# Patient Record
Sex: Male | Born: 1965 | Race: White
Health system: Northeastern US, Academic
[De-identification: ages and names within clinical notes are randomized; demographics above are authoritative.]

---

## 2011-05-30 ENCOUNTER — Inpatient Hospital Stay: Payer: Self-pay | Admitting: Unknown Physician Specialty

## 2011-06-12 ENCOUNTER — Inpatient Hospital Stay: Payer: Self-pay | Admitting: Psychiatry

## 2012-09-15 IMAGING — CT CT HEAD WITHOUT CONTRAST
2 series · 16 of 30 positions shown, 20 images · non-contrast
Comparison: none

REASON FOR EXAM: Head Injury secondary to fall
COMMENTS:

PROCEDURE:     CT  - CT HEAD WITHOUT CONTRAST  - June 16, 2011 [DATE]
RESULT:
HISTORY: Fall.
COMPARISON STUDIES:  No prior.
PROCEDURE AND FINDINGS:  Standard nonenhanced CT obtained. No mass lesion.
No hydrocephalus or hemorrhage. No acute bony abnormality.

[Series 2: without · axial · non-contrast · 0.45mm/px · z∈[-178,-54]mm · 13 of 31 slices shown, 17 images]
[im 3/31  brain]
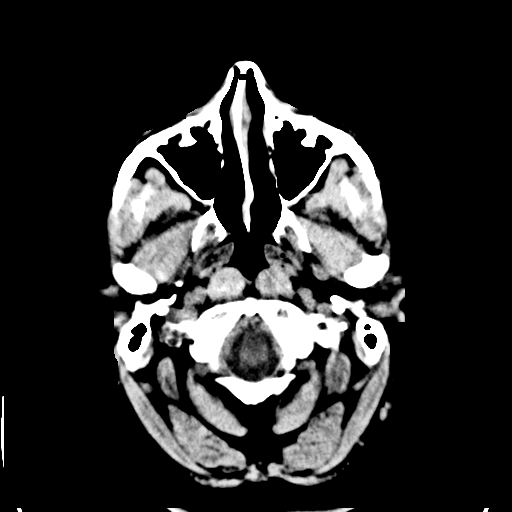
[im 3/31  bone]
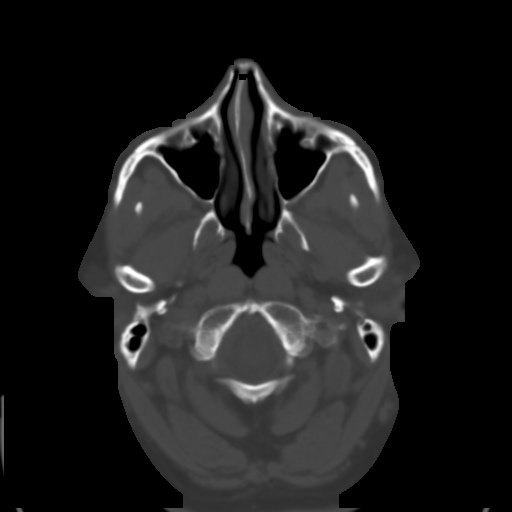
[im 5/31  brain]
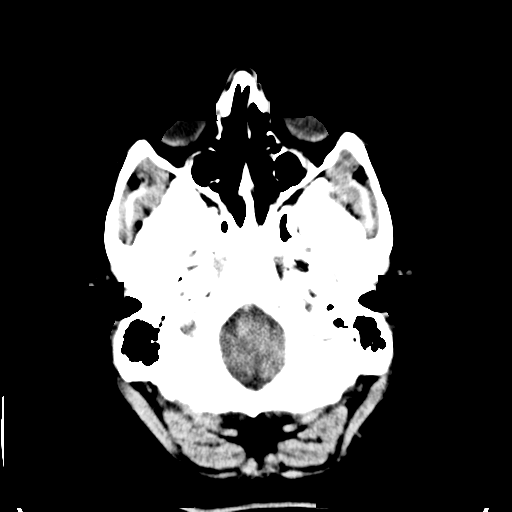
[im 7/31  brain]
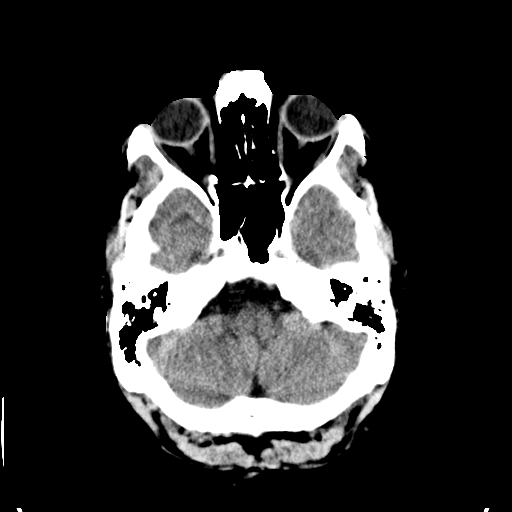
[im 9/31  brain]
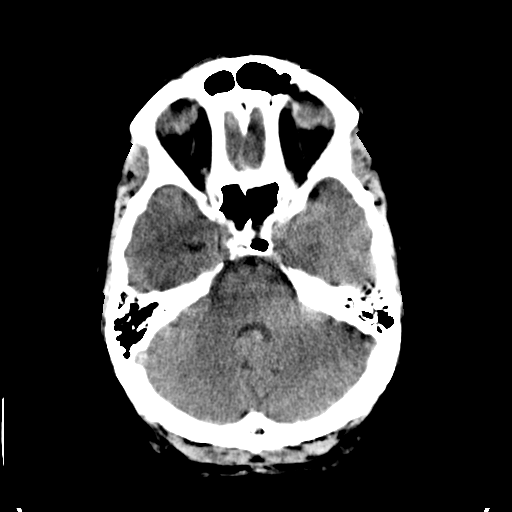
[im 11/31  brain]
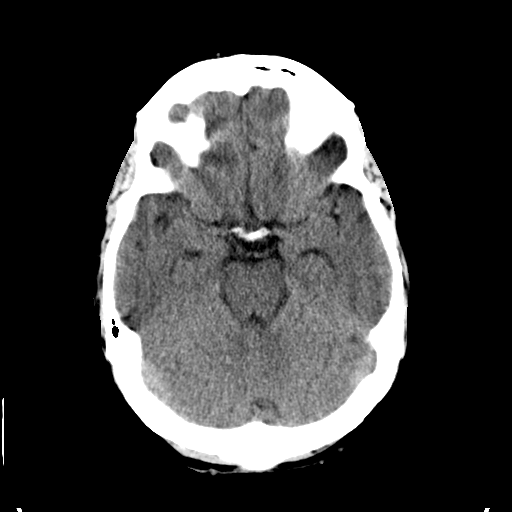
[im 11/31  bone]
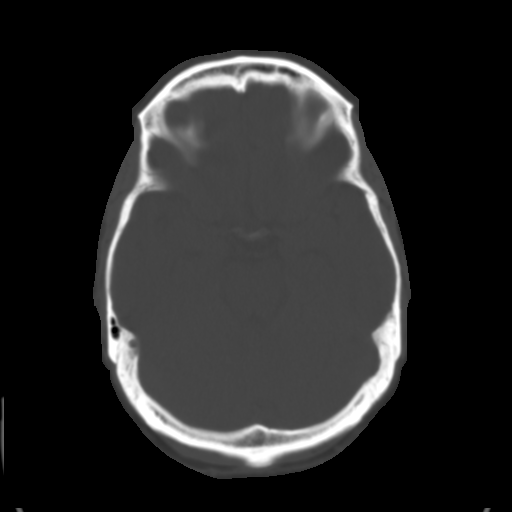
[im 13/31  brain]
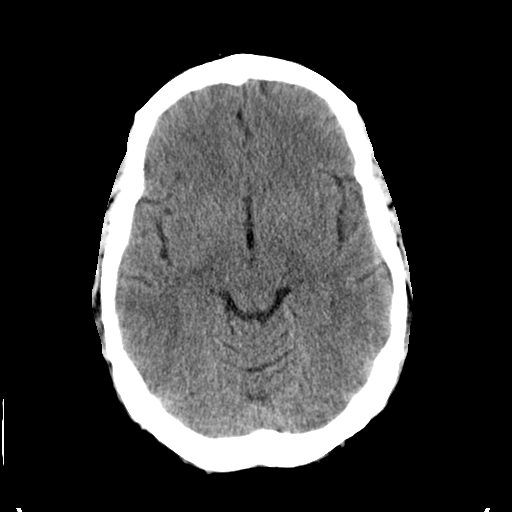
[im 16/31  brain]
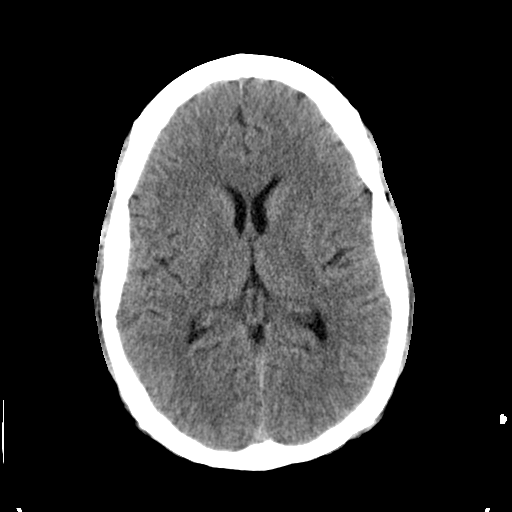
[im 18/31  brain]
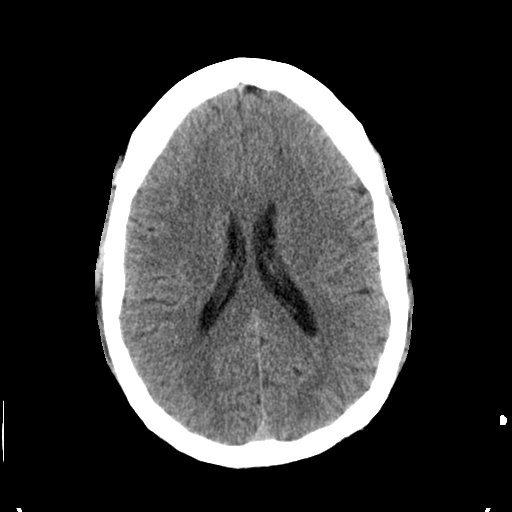
[im 20/31  brain]
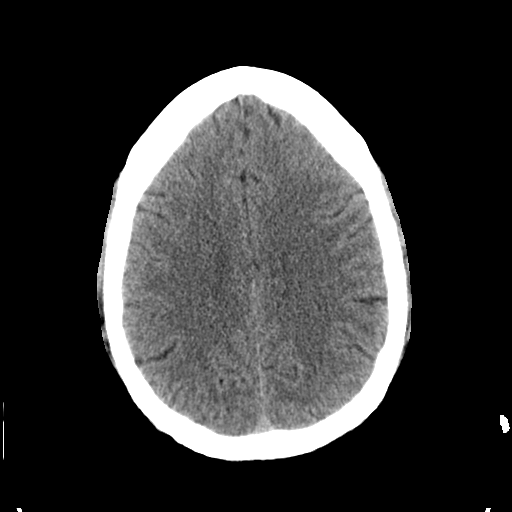
[im 20/31  bone]
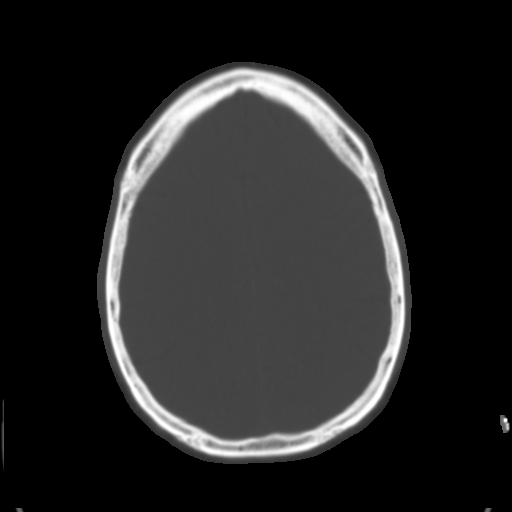
[im 22/31  brain]
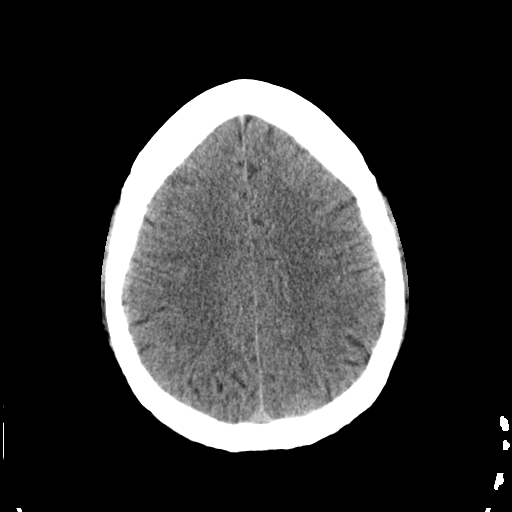
[im 24/31  brain]
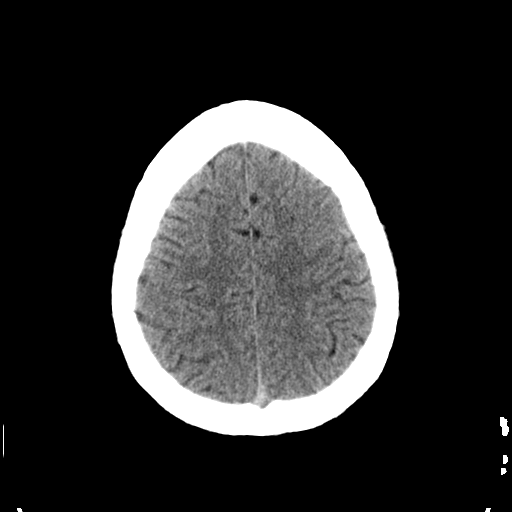
[im 26/31  brain]
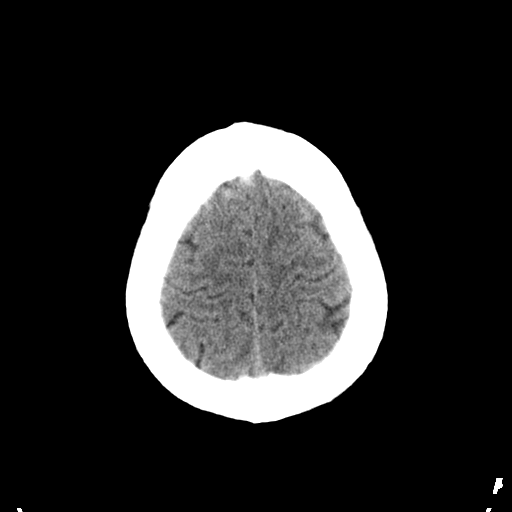
[im 28/31  brain]
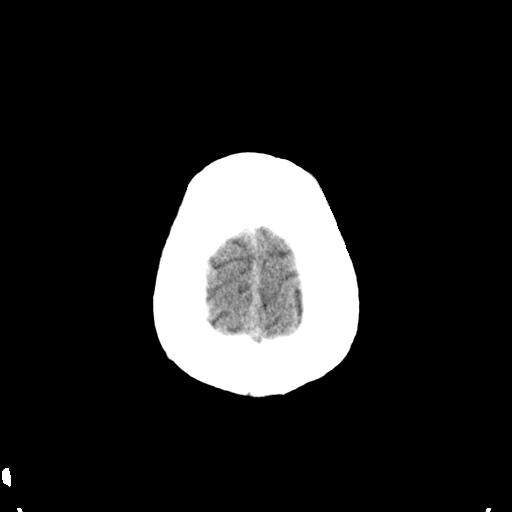
[im 28/31  bone]
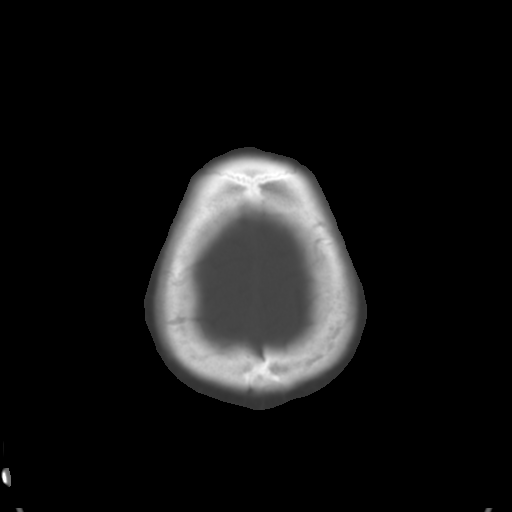

[Series 3: bone · axial · 0.45mm/px · z∈[-178,-138]mm · 3 of 31 slices shown]
[im 3/31  bone]
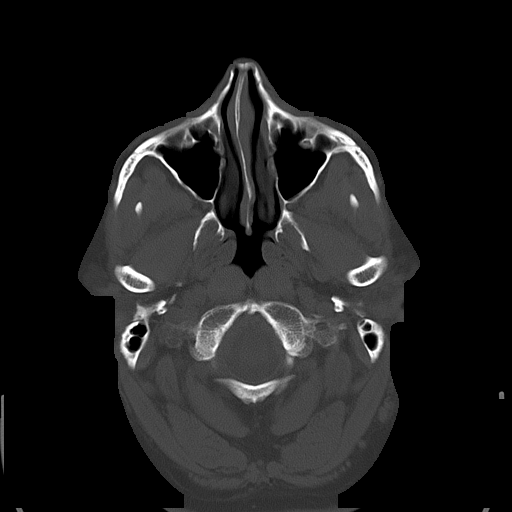
[im 7/31  bone]
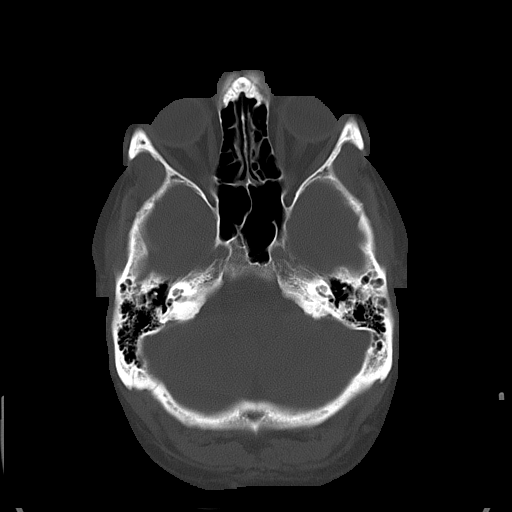
[im 11/31  bone]
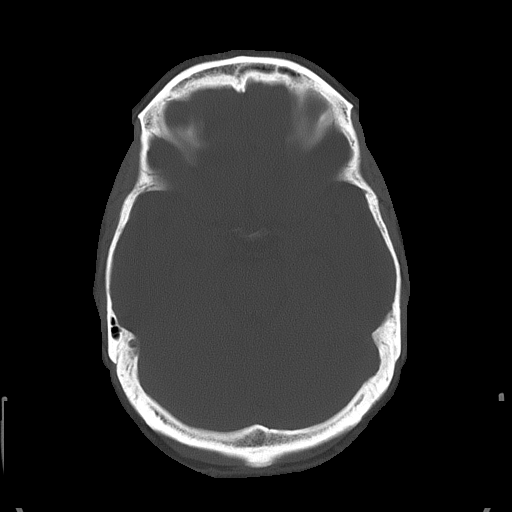

[16 of 30 positions shown; findings below may reference images not displayed]

IMPRESSION: No acute abnormality.

## 2012-11-21 ENCOUNTER — Emergency Department: Payer: Self-pay | Admitting: Emergency Medicine

## 2012-11-21 LAB — CBC
HCT: 44.8 % (ref 40.0–52.0)
HGB: 14.7 g/dL (ref 13.0–18.0)
MCH: 27.1 pg (ref 26.0–34.0)
MCV: 82 fL (ref 80–100)
RDW: 14.7 % — ABNORMAL HIGH (ref 11.5–14.5)
WBC: 7.6 10*3/uL (ref 3.8–10.6)

## 2012-11-21 LAB — COMPREHENSIVE METABOLIC PANEL
Alkaline Phosphatase: 85 U/L (ref 50–136)
BUN: 22 mg/dL — ABNORMAL HIGH (ref 7–18)
Bilirubin,Total: 0.4 mg/dL (ref 0.2–1.0)
Calcium, Total: 9.1 mg/dL (ref 8.5–10.1)
Chloride: 103 mmol/L (ref 98–107)
Creatinine: 0.94 mg/dL (ref 0.60–1.30)
EGFR (African American): >60
Glucose: 148 mg/dL — ABNORMAL HIGH (ref 65–99)
Osmolality: 280 (ref 275–301)
Potassium: 4.1 mmol/L (ref 3.5–5.1)
SGOT(AST): 32 U/L (ref 15–37)
SGPT (ALT): 73 U/L (ref 12–78)
Total Protein: 8.6 g/dL — ABNORMAL HIGH (ref 6.4–8.2)

## 2012-11-21 LAB — DRUG SCREEN, URINE
Barbiturates, Ur Screen: NEGATIVE (ref ?–200)
Cannabinoid 50 Ng, Ur ~~LOC~~: NEGATIVE (ref ?–50)
MDMA (Ecstasy)Ur Screen: NEGATIVE (ref ?–500)
Opiate, Ur Screen: NEGATIVE (ref ?–300)
Tricyclic, Ur Screen: NEGATIVE (ref ?–1000)

## 2012-11-21 LAB — URINALYSIS, COMPLETE
Blood: NEGATIVE
Nitrite: NEGATIVE
Ph: 5 (ref 4.5–8.0)
Protein: NEGATIVE
RBC,UR: NONE SEEN /HPF (ref 0–5)
Specific Gravity: 1.012 (ref 1.003–1.030)

## 2012-11-21 LAB — ETHANOL
Ethanol %: <0.003 % (ref 0.000–0.080)
Ethanol: <3 mg/dL

## 2012-11-22 LAB — VALPROIC ACID LEVEL: Valproic Acid: 38 ug/mL — ABNORMAL LOW

## 2015-03-27 NOTE — Consult Note (Signed)
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   Patient was seen by consultant.   Consult note dictated.   Recommend further assessment or treatment.   Discussed with Attending MD.   Comments: Mr. Joshua Roman was [facility]. [place] [facility] he became suicidal and came to our ED. He is homeless, a sex offender and has [facility] benefits.   PLAN: 1. We restarted all his discharge medications.   2. [facility] was [facility].   3. i will follow up.  Electronic Signatures: Kristine LineaPucilowska, Shanitra Phillippi (MD)  (Signed 16-Dec-13 17:23)  Authored: Brief Consult Note   Last Updated: 16-Dec-13 17:23 by Kristine LineaPucilowska, Zackarey Holleman (MD)

## 2015-03-27 NOTE — Consult Note (Signed)
PATIENT NAME:  Joshua Roman, Joshua Roman MR#:  [id number] DATE OF BIRTH:  07-16-1966  DATE OF CONSULTATION:  11/22/2012  REFERRING PHYSICIAN:  Dr. Janalyn Harder. CONSULTING PHYSICIAN:  Maysoon Lozada B. Ireoluwa Gorsline, MD  REASON FOR CONSULTATION: To evaluate a suicidal patient.   IDENTIFYING DATA: The patient is a 49 year old male with history of depression.   CHIEF COMPLAINT: "I don't know."  HISTORY OF PRESENT ILLNESS: The patient overdosed on Vistaril. He was taken to [facility] as he was living at [facility] in [place].  After 2 days in the Emergency Room, he was accepted to a hospital in [facility]. He was hospitalized there between December 12th through December 15th. He was discharged from the hospital. When his brother came to pick him up, he found the patient sitting on a bench in front of the hospital. Apparently, no information was given to the brother. On their way from [facility] to [place], the brother decided to bring the patient to the hospital here as he himself is a resident of [place]. The patient has been complaining that hospitalization was not helpful and he was still suicidal.  In the Emergency Room, the patient was mute and did not want to provide much information. A lot of information is obtained from his brother, from the chart, although with me the patient was a little more conversational. He has been treated for mental illness at the [facility] in [place]. He has been hospitalized multiple times but at least 8 times this year. They were on diversion, that is why the patient went to [facility]. He has a history of bipolar disorder. He is also a sex offender which makes his placement very difficult. Apparently, [facility]  was sponsoring this patient to live at [facility]. When we called them, they are not certain if they want this patient back in spite of arrangements. The patient reports that he feels at the same time manic and depressed. He complains of severe insomnia and  racing thoughts with continuous thoughts of suicide. He is hopeless, worthless, and feels guilty; has depressed mood, crying spells, irritability. He has suicidal ideation with a plan to overdose again. He is tired of living and does not care anymore. He denies any current substance use.   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 On the day of overdose, however, he did get drunk. This may be preclude him from returning to [facility].  FAMILY PSYCHIATRIC HISTORY: None reported.   PAST MEDICAL HISTORY: COPD, arthritis,  hypertension.   ALLERGIES: DEMEROL, ROBINUL.    MEDICATIONS ON ADMISSION: Amlodipine 5 mg daily. Risperdal 2 mg twice daily. Albuterol 90 mcg as needed. Depakene 500 mg 3 times daily. Protonix 40 mg daily. Lopid 600 mg twice daily. Naproxen 500 mg twice daily. Metformin 500 mg twice daily. Ibuprofen 600 mg every 8 hours.   SOCIAL HISTORY: As above. 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 He is a sex offender. His placement is very difficult. He has a brother in [place]. He has been living at [facility], a rehab program in [place], somehow sponsored by the [place] system but is not allowed to return there anymore.   REVIEW OF SYSTEMS:   CONSTITUTIONAL: No fevers or chills. Positive for fatigue.  EYES: No double or blurred vision.  ENT: No hearing loss.  RESPIRATORY: No shortness of breath or cough.  CARDIOVASCULAR: No chest pain or orthopnea.  GASTROINTESTINAL: No abdominal pain, nausea, vomiting, or diarrhea.  GENITOURINARY: No incontinence or frequency.  ENDOCRINE: No heat or cold intolerance.  LYMPHATIC: No anemia or easy bruising.  INTEGUMENTARY: No acne or rash.  MUSCULOSKELETAL: Positive for muscle and joint pain.  NEUROLOGIC: No tingling or weakness.  PSYCHIATRIC: See history of present illness for details.   PHYSICAL EXAMINATION: VITAL SIGNS: Blood pressure 103/56, pulse 88, respirations 18,  temperature 97.7.  GENERAL: This is an obese male in no acute distress. The rest of the physical examination is deferred to his primary attending.   LABORATORY DATA: Chemistries are within normal limits except for blood glucose of 148 and BUN of 22. Blood alcohol level is 0. LFTs within normal limits. TSH 1.7. Depakote level 38. Urine tox screen negative for substances. CBC within normal limits. Urinalysis is not suggestive of urinary tract infection.   MENTAL STATUS EXAMINATION: The patient is alert and oriented to person, place, time, and situation. He is pleasant and polite. He seems frightened and there were long periods of time when would not answer any questions this morning, or did not want to talk to anybody. He did get better with the intake nurse, Thayer Ohmhris, and disclosed a lot of information to her.  With me, he is brief but to the point and clearly has been depressed and suffering. He is wearing hospital scrubs. His grooming is adequate. He maintains good eye contact. His speech is of normal rhythm, rate, and volume. There is paucity of speech. Mood is depressed with anxious affect. Thought processing is logical and goal oriented. THOUGHT CONTENT: He endorses suicidal ideation. There are no thoughts of hurting others. There are no auditory or visual hallucinations. His cognition is grossly intact. His insight and judgment are fair.   SUICIDE RISK ASSESSMENT: This is a patient with multiple suicide attempts and mood instability who attempted suicide a few days ago.   DIAGNOSES: AXIS I:  Mood disorder, not otherwise specified.  AXIS II: Deferred.  AXIS III: Chronic obstructive pulmonary disease, hypertension, arthritis.  AXIS IV: Mental illness, primary support, housing,  AXIS V: GAF 25.   PLAN:  1.  Will restart all his medication as at discharge from his last hospitalization.  2.  I will increase the dose of Risperdal to 3 mg twice daily to address bipolar disorder symptoms.  3.  I will  add Seroquel 150 mg at night for sleep per the patient's request.  4.  [facility].  5.  I will follow up.       ____________________________ Braulio ConteJolanta B. Jennet MaduroPucilowska, MD jbp:cs D: 11/22/2012 21:32:50 ET T: 11/23/2012 15:09:38 ET JOB#: [id number]  cc: Khalon Cansler B. Jennet MaduroPucilowska, MD, <Dictator> Shari ProwsJOLANTA B Mahasin Riviere MD ELECTRONICALLY SIGNED 11/24/2012 6:21

## 2016-05-04 ENCOUNTER — Ambulatory Visit: Admitting: Emergency Medicine

## 2016-05-04 ENCOUNTER — Emergency Department
Admit: 2016-05-04 | Disposition: A | Discharge status: Home / Self Care | Source: Ambulatory Visit | Attending: Emergency Medicine | Admitting: Emergency Medicine

## 2016-05-04 NOTE — ED Provider Notes (Signed)
Marland Kitchen  Name: Trevante, Tennell  MRN: [id number]  Age: 50 yrs  Sex: Male  DOB: 1966-04-06  Arrival Date: 05/04/2016  Arrival Time: 11:02  Account#: [id number]  .  Working Diagnosis: Epilepsy and recurrent seizures  PCP:  .  Historical:  - Allergies: Demerol;  - Home Meds: Klonopin Oral; Metformin Oral; Haldol Oral;  - PMHx: Seizures; Diabetes - NIDDM; psych issues;  - Social history: Smoking status: Patient uses tobacco    products, current every day smoker. The patient lives in a    shelter.  .  .  Vital Signs:  05/28  11:05 BP 97 / 62; Pulse 100; Resp 18; Temp 37.1; Pulse Ox 96% on R/A; jc9  12:57 BP 112 / 65; Pulse 106; Resp 20; Pulse Ox 99% on R/A;           jg15  .  MDM:  .  05/28  11:08 Order name: AccuCheck(POC); Complete Time: 11:10                kp7  05/28  11:08 Order name: Seizure Precautions; Complete Time: 11:10           kp7  05/28  11:08 Order name: Monitor; Complete Time: 11:10                       kp7  .  Dispensed Medications:  12:46 Not Given (pt no longer in room. Dr Metro Kung notified): Depakote jg15        500 mg PO once  12:56 Drug: Depakote 500 mg Route: PO;                                jg15  .  Marland Kitchen  Attending Notes:  12:43 Attestation: Assessment and care plan reviewed with             mm10        resident/midlevel provider. See their note for details.        Physician Assistant's history reviewed, patient interviewed and        examined. Attending HPI: This 50 year old man has a known        seizure disorder apparently had a witnessed seizure prior to        arrival. He does have a history of bipolar disorder and he says        that he is treated usually with Depakote 1000 mg every night.        He did say he took his Depakote last night. At this time he is        awake alert oriented and has no complaints and does not want        any testing at all. I do long conversation with him and asked        him if we should give him an extra Depakote and then he can        take his normal thousand  milligrams this evening he said that  .  Name:Giammarco, Mick  [id number]  [id number]  Page 1 of 3  %%PAGE  .  Name: Petra, Sargeant  MRN: [id number]  Age: 50 yrs  Sex: Male  DOB: 12-Apr-1966  Arrival Date: 05/04/2016  Arrival Time: 11:02  Account#: [id number]  .  Working Diagnosis: Epilepsy and recurrent seizures  PCP:  .        would be a good plan. He denies  any drug use any head trauma or        any complaints at this time.. Attending ROS Constitutional: No        fever no weight loss Eyes: no visual complaint ENT: no hearing        loss , runny nose or sore throat Cardiovascular: No chest pain        or palpitations Respiratory: No cough or SOB Abdomen/GI: NO abd        pain Nausea or vomiting MS/Extremity: no ext pain Skin: No rash        Neuro: Positive for seizure activity, Negative for altered        mental status, dizziness, gait disturbance, headache. Attending        Exam: Constitutional: Non toxic no distress Eyes: PERLA, EOMI,        No redness ENT: NC/AT, PERLA, EOMI, TM's normal, Post pharynx        pink and moist. Respiratory: Lung CTA bilaterally.        Chest/axilla: Chest normal AP diamenter. Cardiovascular: RRR,        No S3 S4 or Murmur Abdomen/GI: Abd soft non tender active BS        all quads, No CVA tenderness. MS/ Extremity: No ext pain or        swelling, good distal pulses. Neuro: Alert and Oriented x3, CN        2- 12 intact B/L, 5/5 motor all ext equal Bilateral, No sensory        deficit to LT, Normal gait. I have reviewed the Nurses Notes,        Old Records in: Other We got old medical records from an        outside hospital. I reviewed them. ED Course: Patient really        does want to leave I did talk to him he is alert he is oriented        he denies any suicidal ideation or plan is no auditory or        visual hallucinations this seizure history he is a        long-standing problem he does not want [place] to draw any bloods at        this time and I don't believe I can hold  him against his will.        His plan that he would like [place] to take an extra Depakote now        and followup with his regular doctor. My Working Impression:        Exacerbation of seizure disorder. Attending chart complete and        electronically signed: Valera Castle, D.O. FACEP.  Marland Kitchen  Disposition Summary:  13:23 05/04/2016 12:45 Discharged to Home. Impression: Epilepsy and   kp7        recurrent seizures. Condition is Stable. Discharge        Instructions: SEIZURE, Recurrent [Adult]. Forms are Medication        Reconciliation Form. Follow up: Private Physician; When: 3 - 5        days; Reason: Continuance of care. Problem is new. Symptoms        have improved.  .  Signatures:  Mostofi, Matt                           DO   mm10  Cogliano, [place]  BSN  tc6  Orlinda Blalock                  RN   jc9  Brayton Mars                        RN   [phone]  .  Name:Yount, Gifford  [id number]  [id number]  Page 2 of 3  %%PAGE  .  Name: Canuto, Kingston  MRN: [id number]  Age: 50 yrs  Sex: Male  DOB: [birth year]  Arrival Date: 05/04/2016  Arrival Time: 11:02  Account#: [id number]  .  Working Diagnosis: Epilepsy and recurrent seizures  PCP:  .  Army Melia  Powers, Katrina                         PA   kp7  .  Document is preliminary until electronically or manually signed by the atte  nding physician  .  .  .  .  .  .  .  .  .  .  .  .  .  .  .  .  .  .  .  .  .  .  .  .  .  .  .  .  .  .  .  .  .  .  .  .  .  .  .  Name:Adinolfi, Arnel  [id number]  [id number]  Page 3 of 3  .  %%END

## 2016-05-04 NOTE — ED Provider Notes (Signed)
Marland Kitchen  Name: Desiree, Fleming  MRN: [id number]  Age: 50 yrs  Sex: Male  DOB: 1966/08/08  Arrival Date: 05/04/2016  Arrival Time: 11:02  Account#: [id number]  Bed ASW2  PCP:  Chief Complaint: Seizure  .  Presentation:  05/28  11:03 Presenting complaint: EMS states: witnessed tonic clonic sz     jc9        without trauma. Care prior to arrival: Glucose check. 72.  11:03 Method Of Arrival: EMS: [facility]                              jc9  11:03 Acuity: Adult 3                                                 jc9  .  Historical:  - Allergies:  11:05 Demerol;                                                        jc9  - Home Meds:  11:05 Klonopin Oral [Active]; Metformin Oral [Active]; Haldol Oral    jc9        [Active];  - PMHx:  11:05 Seizures; Diabetes - NIDDM; psych issues;                       jc9  .  - Social history: Smoking status: Patient uses tobacco    products, current every day smoker. The patient lives in a    shelter.  .  .  Screening:  11:06 SEPSIS SCREENING SIRS Criteria (> = 2) No. Safety screen:       jc9        Patient feels safe. Suicide Screening: Patient denies thoughts        of harm. Fall Risk Yellow Socks Fall precaution wrist band Fall        precaution signage. Exposure Risk/Travel Screening: None        identified.  .  Vital Signs:  11:05 BP 97 / 62; Pulse 100; Resp 18; Temp 37.1; Pulse Ox 96% on R/A; jc9  12:57 BP 112 / 65; Pulse 106; Resp 20; Pulse Ox 99% on R/A;           jg15  .  Triage Assessment:  11:11 General: Pt appears in no acute distress, alert oriented,       jc9        affect flat, airway patent resps unlaboured. Pt is refusing to        have FSBS done. .  .  Assessment:  11:15 Reassessment: Pt with hx sz here s/p witnessed clonic tonic     jg15        event. Currently flat and refusing repeat FS or IV/labs. States        he does not drink, takes his meds as ordered but does not know        meds.  .  Name:Nunnelley, Everet  [id number]  [id number]  Page 1 of  3  %%PAGE  .  Name: Velma, Hanna  MRN: [id number]  Age: 87 yrs  Sex:  Male  DOB: 1966-12-03  Arrival Date: 05/04/2016  Arrival Time: 11:02  Account#: [id number]  Bed ASW2  PCP:  Chief Complaint: Seizure  .  11:15 General: Appears in no apparent distress, Behavior is flat,     jg15        uncooperative, Denies fever, feeling ill.  11:15 Pain: Denies pain. Neuro: Level of Consciousness is awake,      jg15        alert, obeys commands, Oriented to person, place.        Cardiovascular: Capillary refill < 3 seconds. Respiratory:        Respiratory effort is even, unlabored. Skin: Skin is pink, warm        / dry.  12:11 Reassessment: Standing in doorway refusing to get back in bed,  jg15        states he feels fine. PA Powers informed.  13:22 Reassessment: Pt no longer in room. PA informed. Pt then back   jg15        from smoking and depakote given.  .  Observations:  11:02 Patient arrived in ED.                                          jc9  11:04 Triage Completed.                                               jc9  11:07 Patient Visited By: Naomie Dean                             kp7  11:12 Patient Visited By: Army Melia  11:32 Patient Visited By: Miachel Roux                               mm10  12:42 Patient Visited By: Girtha Rm  .  Dispensed Medications:  12:46 Not Given (pt no longer in room. Dr Metro Kung notified): Depakote jg15        500 mg PO once  12:56 Drug: Depakote 500 mg Route: PO;                                jg15  .  Marland Kitchen  Interventions:  11:07 Patient placed in exam room on stretcher in view of nurse on    jc9        pulse oximetry, seizure precautions.  11:21 EMS Sheet Scanned into Chart                                    tl10  11:30 Demo Sheet Scanned into Chart  ph3  11:54 Other: Medical records request form Scanned into Chart          tl10  12:34 Outside Patient Records Scanned into Chart                       tl10  .  Outcome:  12:45 Discharge ordered by MD.                                        Thurman Coyer  13:23 Patient left the ED.                                            tc6  .  Corrections: (The following items were deleted from the chart)  12:47 11:15 Reassessment: Pt with hx sz here s/p witnessed clonic     jg15        tonic event. Currently flat and refusing repeat FS or IV/labs.        States he does not drink, takes his meds as ordered but does  .  Name:Deshmukh, Riggin  [id number]  [id number]  Page 2 of 3  %%PAGE  .  Name: Edris, Friedt  MRN: [id number]  Age: 50 yrs  Sex: Male  DOB: 09/20/1966  Arrival Date: 05/04/2016  Arrival Time: 11:02  Account#: [id number]  Bed ASW2  PCP:  Chief Complaint: Seizure  .        not know meds jg15  12:57 12:46 General: Appears in no apparent distress, Behavior is     jg15        flat, uncooperative, Denies fever, feeling ill, jg15  .  Signatures:  Miachel Roux                           DO   mm10  Cogliano, [place]                       BSN  tc6  Brainards, [place]                  RN   jc9  Brayton Mars                        RN   jg15  Hyman Bower, Katrina                         PA   kp7  Payton Doughty                             tl10  Ardyth Harps, [place]                       Sec  ph3  .  .  .  .  .  .  .  .  .  .  .  .  .  .  .  .  .  .  .  .  .  .  .  .  .  .  .  .  .  Marland Kitchen  Name:Chilton, Ahkeem  [id number]  [id number]  Page 3 of 3  .  %%END

## 2017-01-13 ENCOUNTER — Emergency Department
Admit: 2017-01-13 | Discharge status: Against Medical Advice | Source: Home / Self Care | Attending: Internal Medicine | Admitting: Internal Medicine

## 2017-01-13 LAB — HX DRUGS OF ABUSE URINE 9
CASE NUMBER: 2018037001411
HX U AMPHETAMINES SCRN: NEGATIVE — NL
HX U BARBITURATE SCRN: NEGATIVE — NL
HX U BENZODIAZEPINE SCRN: NEGATIVE — NL
HX U CANNABINOID SCRN: NEGATIVE — NL
HX U COCAINE SCRN: NEGATIVE — NL
HX U ETHANOL INTERP: NEGATIVE — NL
HX U ETHANOL: <3 — NL (ref 0.0–49.0)
HX U METHADONE SCRN: NEGATIVE — NL
HX U OPIATE SCRN: NEGATIVE — NL
HX U PH FOR DAU: 6 — NL (ref 4.5–8.0)
HX U PHENCYCLIDINE SCRN: NEGATIVE — NL

## 2017-01-13 LAB — HX COMPREHENSIVE METABOLIC PANEL
CASE NUMBER: 2018037001410
HX ALBUMIN LVL: 3.6 g/dL — NL (ref 3.5–5.0)
HX ALKALINE PHOSPHATASE: 46 U/L — NL (ref 45.0–117.0)
HX ALT: 34 U/L — NL (ref 6.0–78.0)
HX ANION GAP: 13 — ABNORMAL HIGH (ref 3.0–11.0)
HX AST: 12 U/L — NL (ref 6.0–40.0)
HX BILIRUBIN TOTAL: 0.2 mg/dL — NL (ref 0.2–1.2)
HX BUN: 10 mg/dL — NL (ref 7.0–18.0)
HX CALCIUM LVL: 8.5 mg/dL — NL (ref 8.5–10.1)
HX CHLORIDE: 96 mmol/L — ABNORMAL LOW (ref 98.0–110.0)
HX CO2: 24 mmol/L — NL (ref 21.0–32.0)
HX CREATININE: 0.884 mg/dL — NL (ref 0.55–1.3)
HX GLUCOSE LVL: 261 mg/dL — ABNORMAL HIGH (ref 65.0–110.0)
HX POTASSIUM LVL: 3.7 mmol/L — NL (ref 3.6–5.2)
HX SODIUM LVL: 133 mmol/L — ABNORMAL LOW (ref 136.0–145.0)
HX TOTAL PROTEIN: 7.4 g/dL — NL (ref 6.0–8.0)

## 2017-01-13 LAB — HX CBC W/ DIFF
CASE NUMBER: 2018037001410
HX HCT: 42.5 % — NL (ref 39.0–50.0)
HX HGB: 14.5 g/dL — NL (ref 13.0–17.0)
HX MCH: 29 pg — NL (ref 27.0–31.0)
HX MCHC: 34.1 g/dL — NL (ref 32.0–36.0)
HX MCV: 85 fL — NL (ref 80.0–94.0)
HX MPV: 9.7 fL — NL (ref 7.4–11.5)
HX NRBC PERCENT: 0 % — NL
HX PLATELET: 229 10*3/uL — NL (ref 150.0–400.0)
HX RBC: 5 10*6/uL — NL (ref 4.2–5.4)
HX RDW: 13.8 % — NL (ref 11.5–14.5)
HX WBC: 7.1 10*3/uL — NL (ref 3.6–10.5)

## 2017-01-13 LAB — HX .AUTOMATED DIFF
CASE NUMBER: 2018037001410
HX ABSOLUTE NEUTRO COUNT: 4760 /mm3
HX BASOPHILS: 1 % — NL (ref 0.0–1.0)
HX EOSINOPHILS: 2 % — NL (ref 0.0–3.0)
HX IMMATURE GRANULOCYTES: 1 % — NL (ref 0.0–2.0)
HX LYMPHOCYTES: 22 % — NL (ref 22.0–40.0)
HX MONOCYTES: 7 % — NL (ref 0.0–11.0)
HX NEU CT MEASURED: 4.76
HX NEUTROPHILS: 67 % — NL (ref 40.0–71.0)

## 2017-01-13 LAB — HX URINE DIPSTICK W/REFLEX
CASE NUMBER: 2018037001411
HX UA BILIRUBIN: NEGATIVE — NL
HX UA BLOOD: NEGATIVE — NL
HX UA LEUKOCYTE ESTERASE: NEGATIVE — NL
HX UA NITRITE: NEGATIVE — NL
HX UA PH: 6 — NL (ref 5.0–8.0)
HX UA PROTEIN: NEGATIVE — NL
HX UA RBC: <1 — NL (ref 0.0–3.0)
HX UA SPECIFIC GRAVITY: 1.011 — NL (ref 1.005–1.03)
HX UA SQUAMOUS EPITHELIAL: <1 — NL (ref 0.0–5.0)
HX UA UROBILINOGEN: NEGATIVE — NL
HX UA WBC: <1 — NL (ref 0.0–5.0)

## 2017-01-13 LAB — HX BLUE TOP TO HOLD: CASE NUMBER: 2018037001410

## 2017-01-13 LAB — HX VALPROIC ACID LEVEL
CASE NUMBER: 2018037001410
HX VALPROIC ACID LVL: 75.5 ug/mL — NL (ref 50.0–100.0)

## 2017-01-13 LAB — HX PHENYTOIN LEVEL TOTAL
CASE NUMBER: 2018037001410
HX PHENYTOIN TOTAL: <0.4 — ABNORMAL LOW (ref 10.0–20.0)

## 2017-01-13 LAB — HX GLOMERULAR FILTRATION RATE (ESTIMATED)
CASE NUMBER: 2018037001410
HX AFN AMER GLOMERULAR FILTRATION RATE: >90
HX NON-AFN AMER GLOMERULAR FILTRATION RATE: >90

## 2017-01-13 LAB — HX SST GOLD TUBE TO HOLD: CASE NUMBER: 2018037001410

## 2018-04-14 ENCOUNTER — Other Ambulatory Visit: Payer: Self-pay | Admitting: Orthopedic Surgery

## 2018-04-14 DIAGNOSIS — M533 Sacrococcygeal disorders, not elsewhere classified: Secondary | ICD-10-CM
